# Patient Record
Sex: Female | Born: 1955 | Race: White | Hispanic: No | Marital: Married | Smoking: Never smoker
Health system: Southern US, Community
[De-identification: ages and names within clinical notes are randomized; demographics above are authoritative.]

## PROBLEM LIST (undated history)

## (undated) DIAGNOSIS — I10 Essential (primary) hypertension: Secondary | ICD-10-CM

## (undated) DIAGNOSIS — E079 Disorder of thyroid, unspecified: Secondary | ICD-10-CM

## (undated) HISTORY — PX: BLADDER REPAIR: SHX76

## (undated) HISTORY — PX: BREAST SURGERY: SHX581

---

## 2015-11-18 ENCOUNTER — Emergency Department (HOSPITAL_BASED_OUTPATIENT_CLINIC_OR_DEPARTMENT_OTHER)
Admission: EM | Admit: 2015-11-18 | Discharge: 2015-11-18 | Disposition: A | Discharge status: Home / Self Care | Payer: 59 | Attending: Emergency Medicine | Admitting: Emergency Medicine

## 2015-11-18 ENCOUNTER — Encounter (HOSPITAL_BASED_OUTPATIENT_CLINIC_OR_DEPARTMENT_OTHER): Payer: Self-pay | Admitting: *Deleted

## 2015-11-18 ENCOUNTER — Emergency Department (HOSPITAL_BASED_OUTPATIENT_CLINIC_OR_DEPARTMENT_OTHER): Payer: 59

## 2015-11-18 DIAGNOSIS — E079 Disorder of thyroid, unspecified: Secondary | ICD-10-CM | POA: Diagnosis not present

## 2015-11-18 DIAGNOSIS — S61304A Unspecified open wound of right ring finger with damage to nail, initial encounter: Secondary | ICD-10-CM | POA: Diagnosis present

## 2015-11-18 DIAGNOSIS — Y9301 Activity, walking, marching and hiking: Secondary | ICD-10-CM | POA: Diagnosis not present

## 2015-11-18 DIAGNOSIS — Z23 Encounter for immunization: Secondary | ICD-10-CM | POA: Diagnosis not present

## 2015-11-18 DIAGNOSIS — Y9289 Other specified places as the place of occurrence of the external cause: Secondary | ICD-10-CM | POA: Diagnosis not present

## 2015-11-18 DIAGNOSIS — W228XXA Striking against or struck by other objects, initial encounter: Secondary | ICD-10-CM | POA: Diagnosis not present

## 2015-11-18 DIAGNOSIS — Y998 Other external cause status: Secondary | ICD-10-CM | POA: Diagnosis not present

## 2015-11-18 DIAGNOSIS — G43809 Other migraine, not intractable, without status migrainosus: Secondary | ICD-10-CM | POA: Diagnosis not present

## 2015-11-18 HISTORY — DX: Disorder of thyroid, unspecified: E07.9

## 2015-11-18 MED ORDER — HYDROMORPHONE HCL 1 MG/ML IJ SOLN: 1.0000 mg | Freq: Once | INTRAMUSCULAR | Status: DC

## 2015-11-18 MED ORDER — CEPHALEXIN 500 MG PO CAPS: 500.0000 mg | ORAL_CAPSULE | Freq: Four times a day (QID) | ORAL | Status: DC

## 2015-11-18 MED ORDER — TETANUS-DIPHTHERIA TOXOIDS TD 5-2 LFU IM INJ
0.5000 mL | INJECTION | Freq: Once | INTRAMUSCULAR | Status: AC
  Administered 2015-11-18: 0.5 mL via INTRAMUSCULAR
  Filled 2015-11-18: qty 0.5

## 2015-11-18 MED ORDER — HYDROMORPHONE HCL 2 MG PO TABS: 2.0000 mg | ORAL_TABLET | ORAL | Status: DC | PRN

## 2015-11-18 MED ORDER — FENTANYL CITRATE (PF) 100 MCG/2ML IJ SOLN
50.0000 ug | Freq: Once | INTRAMUSCULAR | Status: AC
  Administered 2015-11-18: 50 ug via INTRAVENOUS
  Filled 2015-11-18: qty 2

## 2015-11-18 MED ORDER — DEXTROSE 5 % IV SOLN
1.0000 g | Freq: Once | INTRAVENOUS | Status: AC
  Administered 2015-11-18: 1 g via INTRAVENOUS
  Filled 2015-11-18: qty 10

## 2015-11-18 MED ORDER — ONDANSETRON HCL 4 MG/2ML IJ SOLN
4.0000 mg | Freq: Once | INTRAMUSCULAR | Status: AC
  Administered 2015-11-18: 4 mg via INTRAVENOUS
  Filled 2015-11-18: qty 2

## 2015-11-18 MED ORDER — HYDROMORPHONE HCL 1 MG/ML IJ SOLN
0.5000 mg | Freq: Once | INTRAMUSCULAR | Status: AC
  Administered 2015-11-18: 0.5 mg via INTRAVENOUS
  Filled 2015-11-18: qty 1

## 2015-11-18 MED ORDER — IBUPROFEN 800 MG PO TABS
800.0000 mg | ORAL_TABLET | Freq: Once | ORAL | Status: AC
  Administered 2015-11-18: 800 mg via ORAL
  Filled 2015-11-18: qty 1

## 2015-11-18 MED ORDER — ONDANSETRON 4 MG PO TBDP
4.0000 mg | ORAL_TABLET | Freq: Once | ORAL | Status: AC
  Administered 2015-11-18: 4 mg via ORAL
  Filled 2015-11-18: qty 1

## 2015-11-18 MED FILL — CEPHALEXIN 500 MG CAPSULE: 500 | 5 days supply | Qty: 20 | Fill #0

## 2015-11-18 MED FILL — HYDROmorphone HCL 2 MG TABS: 2 | 3 days supply | Qty: 20 | Fill #0

## 2015-11-18 NOTE — Discharge Instructions (Signed)
Traumatic Finger Amputation °A traumatic finger amputation is when you lose part or all of a finger because of an accident or injury. The severity of this type of injury can vary widely. It can mean that just the tip of your finger gets ripped off (avulsion), or it can mean that you completely lose a finger (amputation). °Traumatic finger amputation is a medical emergency. It requires immediate care to prevent further damage and to save the finger. °CAUSES °A traumatic finger amputation usually results from an accident that involves: °· A car. °· Power tools. °· Factory work. °· Farm or lawn equipment. °SYMPTOMS  °Symptoms of a traumatic finger amputation include: °· Bleeding. °· Pain. °· Damage to surrounding tissues, such as bones, muscles, tendons, and skin. °Severe injuries can sometimes lead to shock, which is a life-threatening condition in which your body fails to get blood, oxygen, and nutrients to vital organs. Some common symptoms of shock include low blood pressure, sweating, weakness, and pale skin. °DIAGNOSIS °Your health care provider will do a physical exam and ask for details about how your injury occurred. The physical exam will help your health care provider to determine the severity of the injury and the best way to repair it. X-rays may be done to check for damage to the surrounding bones and tissues. °TREATMENT °Treatment depends on the type of injury that you have and how bad it is. °· Your health care provider will clean the wound thoroughly and apply a medicine (anesthetic) to relieve pain. °· If just the tip of your finger was removed, the wound will typically heal on its own with a protective bandage (dressing) and regular cleaning. °· For more severe injuries, a portion of skin may need to be taken from another part of the body (graft) and attached to the wound site until the wound heals. °· If a large portion of the finger was amputated, it may be possible to reattach it surgically  (replantation). °HOME CARE INSTRUCTIONS °· Take medicines only as directed by your health care provider. °· If you were prescribed an antibiotic medicine, finish all of it even if you start to feel better. °· There are many different ways to close and cover a wound, including stitches (sutures), skin glue, and adhesive strips. Follow your health care provider's instructions about: °¨ Wound care. °¨ Dressing changes and removal. °¨ Wound closure removal. °· Check your wound every day for signs of infection. Watch for: °¨ Redness, swelling, or pain. °¨ Fluid, blood, or pus. °· Do exercises to strengthen your finger and hand as directed by your health care provider. °SEEK MEDICAL CARE IF: °· Your wound does not seem to be healing well. °· You have redness, swelling, or pain at your wound site. °· You have fluid, blood, or pus coming from your wound. °· You have a fever. °  °This information is not intended to replace advice given to you by your health care provider. Make sure you discuss any questions you have with your health care provider. °  °Document Released: 11/29/2001 Document Revised: 10/11/2014 Document Reviewed: 06/05/2014 °Elsevier Interactive Patient Education ©2016 [employer] ° °

## 2015-11-18 NOTE — ED Provider Notes (Signed)
CSN: [id number]     Arrival date & time 11/18/15  1201 History   First MD Initiated Contact with Patient 11/18/15 1350     Chief Complaint  Patient presents with  . Hand Injury     HPI She was walking her dog this am and the dog leash was wrapped around her hand when the dog was spooked and ran. Her distal 4th digit at the nail was avulsed. Bleeding controlled. Her husband gave her a pain pill.  Past Medical History  Diagnosis Date  . Thyroid disease    Past Surgical History  Procedure Laterality Date  . Bladder repair    . Breast surgery     No family history on file. Social History  Substance Use Topics  . Smoking status: Never Smoker   . Smokeless tobacco: None  . Alcohol Use: No   OB History    No data available     Review of Systems   All other systems reviewed and are negative Allergies  Codeine  Home Medications   Prior to Admission medications   Medication Sig Start Date End Date Taking? Authorizing Provider  cephALEXin (KEFLEX) 500 MG capsule Take 1 capsule (500 mg total) by mouth 4 (four) times daily. 11/18/15   Nelva Nay, MD  HYDROmorphone (DILAUDID) 2 MG tablet Take 1 tablet (2 mg total) by mouth every 4 (four) hours as needed for severe pain. 11/18/15   Nelva Nay, MD  Levothyroxine Sodium (SYNTHROID PO) Take by mouth.   Yes Historical Provider, MD   BP 142/86 mmHg  Pulse 70  Temp(Src) 97.9 F (36.6 C) (Oral)  Resp 18  Ht  (1.651 m)  Wt 160 lb (72.576 kg)  BMI 26.63 kg/m2  SpO2 95% Physical Exam Physical Exam  Nursing note and vitals reviewed. Constitutional: She is oriented to person, place, and time. She appears well-developed and well-nourished. No distress.  HENT:  Head: Normocephalic and atraumatic.  Eyes: Pupils are equal, round, and reactive to light.  Neck: Normal range of motion.  Cardiovascular: Normal rate and intact distal pulses.   Pulmonary/Chest: No respiratory distress.  Abdominal: Normal appearance. She exhibits  no distension.  Musculoskeletal: Normal range of motion.  the distal pad to the fourth finger of the right hand have been evulsed.  There was no active bleeding.  Possible bone exposure. Neurological: She is alert and oriented to person, place, and time. No cranial nerve deficit.  Skin: Skin is warm and dry. No rash noted.  Psychiatric: She has a normal mood and affect. Her behavior is normal.   ED Course  Procedures (including critical care time) Labs Review Labs Reviewed - No data to display  Imaging Review No results found for this or any previous visit. Dg Finger Ring Right  11/18/2015  CLINICAL DATA:  Recent distal fourth finger amputation EXAM: RIGHT RING FINGER 2+V COMPARISON:  None. FINDINGS: Soft tissue defect is noted distally surrounding the distal phalangeal tuft. No definitive bony abnormality is seen. IMPRESSION: No acute fracture noted. Diffuse distal soft tissue amputation is noted. Electronically Signed   By: Alcide Clever M.D.   On: 11/18/2015 12:51       EKG Interpretation None     I discussed the case with the hand surgeon on call who recommended patient have a Xeroflo dressing and she was given update on tetanus and antibiotics.  They will call her office this afternoon or tomorrow for follow-up probably tomorrow morning.  She was given pain medicine. MDM  Avulsion to fingertip.      Nelva Nay, MD 11/26/15 [phone]

## 2015-11-18 NOTE — ED Notes (Signed)
MD at bedside. Dr. Radford Pax in to talk with pt and husband.

## 2015-11-18 NOTE — ED Notes (Signed)
Pt given d/c instructions. Verbalizes understanding. No questions. Rx x 2. Narcotic precaution instructions given.

## 2015-11-18 NOTE — ED Notes (Signed)
She was walking her dog this am and the dog leash was wrapped around her hand when the dog was spooked and ran. Her distal 4th digit at the nail was avulsed. Bleeding controlled. Her husband gave her a pain pill.

## 2020-04-25 ENCOUNTER — Ambulatory Visit: Payer: 59 | Admitting: Orthopedic Surgery

## 2020-05-26 ENCOUNTER — Ambulatory Visit: Payer: Self-pay | Admitting: Surgical

## 2020-06-11 ENCOUNTER — Ambulatory Visit (INDEPENDENT_AMBULATORY_CARE_PROVIDER_SITE_OTHER): Payer: Medicare Other | Admitting: Orthopedic Surgery

## 2020-06-11 ENCOUNTER — Ambulatory Visit (INDEPENDENT_AMBULATORY_CARE_PROVIDER_SITE_OTHER): Payer: Medicare Other

## 2020-06-11 DIAGNOSIS — M79605 Pain in left leg: Secondary | ICD-10-CM | POA: Diagnosis not present

## 2020-06-11 DIAGNOSIS — M5410 Radiculopathy, site unspecified: Secondary | ICD-10-CM | POA: Diagnosis not present

## 2020-06-15 ENCOUNTER — Encounter: Payer: Self-pay | Admitting: Orthopedic Surgery

## 2020-06-15 NOTE — Progress Notes (Signed)
Office Visit Note   Patient: Jodi Brock           Date of Birth: 1956/07/15           MRN: [id number] Visit Date: 06/11/2020 Requested by: Elijio Miles., MD [address] [place],  [place] [address] PCP: Elijio Miles., MD  Subjective: Chief Complaint  Patient presents with  . Left Leg - Pain    HPI: Jodi Brock is a 64 y.o. female who presents to the office complaining of left leg pain.  She notes pain for about 5 months.  Pain comes and goes.  She notes pain is worse at night.  She describes intermittent pain between the groin, calf, thigh as well as associated left leg weakness and numbness/tingling on occasion.  Pain is worse with laying flat on her back.  She denies any surgery or injections in her back.  Denies any history of DVT and does not take any blood thinners.  Pain is not worse with coughing or sneezing and she denies any significant low back pain.  She has seen a chiropractor in the past.  Pain is worsening slowly.  Pain does not wake her up at night.  Pain does cause her to take a while to fall asleep.  She takes Aleve without significant relief.  She notes that her leg "feels cooler"..                ROS: All systems reviewed are negative as they relate to the chief complaint within the history of present illness.  Patient denies fevers or chills.  Assessment & Plan: Visit Diagnoses:  1. Pain in left leg   2. Radicular syndrome of left leg     Plan: Patient is a 64 year old female who presents complaint of left leg pain.  She has 53-month history of leg pain that is worsening slowly.  Pain is worse when she lays flat on her back.  She notes numbness and tingling as well as radicular leg pain that extends down her entire left leg.  Radiographs of the left hip taken today reveal no significant degenerative changes of the left hip joint.  Radiographs of the lumbar spine taken today reveal degenerative changes throughout the lumbar spine with spondylolisthesis noted at  L5-S1.  Impression is that patient is experiencing radicular left leg pain that is originating from her lumbar spine.  Ordered MRI of the lumbar spine for further evaluation.  Follow-up after MRI to review results.  Patient agreed with plan.  She does not take any blood thinners.  Follow-Up Instructions: No follow-ups on file.   Orders:  Orders Placed This Encounter  Procedures  . XR HIP UNILAT W OR W/O PELVIS 2-3 VIEWS LEFT  . XR Lumbar Spine 2-3 Views  . MR Lumbar Spine w/o contrast   No orders of the defined types were placed in this encounter.     Procedures: No procedures performed   Clinical Data: No additional findings.  Objective: Vital Signs: There were no vitals taken for this visit.  Physical Exam:  Constitutional: Patient appears well-developed HEENT:  Head: Normocephalic Eyes:EOM are normal Neck: Normal range of motion Cardiovascular: Normal rate Pulmonary/chest: Effort normal Neurologic: Patient is alert Skin: Skin is warm Psychiatric: Patient has normal mood and affect  Ortho Exam: Ortho exam demonstrates positive straight leg raise of the left leg.  No loss of sensation throughout the bilateral lower extremity dermatomes.  5/5 motor strength of the bilateral hip flexors, quadriceps, hamstring,  dorsiflexion, plantarflexion.  No clonus on exam.  No evidence of hypo or hyperreflexia.  Mild to moderate tenderness throughout the axial lumbar spine.  No tenderness throughout the paraspinal musculature.  No pain with hip range of motion.  Negative Stinchfield exam.  No calf tenderness on exam.  Negative Homans' sign.  Specialty Comments:  No specialty comments available.  Imaging: No results found.   PMFS History: There are no problems to display for this patient.  Past Medical History:  Diagnosis Date  . Thyroid disease     No family history on file.  Past Surgical History:  Procedure Laterality Date  . BLADDER REPAIR    . BREAST SURGERY     Social  History   Occupational History  . Not on file  Tobacco Use  . Smoking status: Never Smoker  Substance and Sexual Activity  . Alcohol use: No  . Drug use: No  . Sexual activity: Not on file

## 2020-06-18 ENCOUNTER — Encounter: Payer: Self-pay | Admitting: Orthopedic Surgery

## 2020-07-01 ENCOUNTER — Other Ambulatory Visit: Payer: Self-pay

## 2020-07-01 ENCOUNTER — Ambulatory Visit
Admission: RE | Admit: 2020-07-01 | Discharge: 2020-07-01 | Disposition: A | Discharge status: Home / Self Care | Payer: Medicare Other | Source: Ambulatory Visit | Attending: Orthopedic Surgery | Admitting: Orthopedic Surgery

## 2020-07-01 DIAGNOSIS — M79605 Pain in left leg: Secondary | ICD-10-CM

## 2020-07-02 ENCOUNTER — Ambulatory Visit (INDEPENDENT_AMBULATORY_CARE_PROVIDER_SITE_OTHER): Payer: Medicare Other | Admitting: Orthopedic Surgery

## 2020-07-02 DIAGNOSIS — M5410 Radiculopathy, site unspecified: Secondary | ICD-10-CM

## 2020-07-06 ENCOUNTER — Encounter: Payer: Self-pay | Admitting: Orthopedic Surgery

## 2020-07-06 NOTE — Progress Notes (Signed)
   Office Visit Note   Patient: Jodi Brock           Date of Birth: 1955/10/06           MRN: [id number] Visit Date: 07/02/2020 Requested by: Elijio Miles., MD [address] [place],  [place] [address] PCP: Elijio Miles., MD  Subjective: No chief complaint on file.   HPI: Ferne Reus is a 64 year old patient with low back pain.  Since have seen her she had an MRI scan.  Reports some left leg and right-sided back pain.  MRI scan is reviewed and she does have some left-sided facet edema at L5-S1.  No definite surgical problem in the back.  Takes occasional Aleve but cannot take Tylenol because of her liver enzyme problem.  Been going on for 8 months and now it slightly better.  Wants to continue to observe without intervention for now.              ROS: All systems reviewed are negative as they relate to the chief complaint within the history of present illness.  Patient denies  fevers or chills.   Assessment & Plan: Visit Diagnoses:  1. Radicular syndrome of left leg     Plan: Impression is left leg radicular symptoms and right-sided back pain all likely related to mild degenerative processes going on in her lumbar spine.  I think if her symptoms warrant then we could consider injection in the back but she wants to hold off on that for now.  She will continue to do some home stretching and strengthening exercises and avoid heavy lifting.  Follow-up as needed but injection would be the next step with her.  Follow-Up Instructions: Return if symptoms worsen or fail to improve.   Orders:  No orders of the defined types were placed in this encounter.  No orders of the defined types were placed in this encounter.     Procedures: No procedures performed   Clinical Data: No additional findings.  Objective: Vital Signs: There were no vitals taken for this visit.  Physical Exam:   Constitutional: Patient appears well-developed HEENT:  Head: Normocephalic Eyes:EOM are  normal Neck: Normal range of motion Cardiovascular: Normal rate Pulmonary/chest: Effort normal Neurologic: Patient is alert Skin: Skin is warm Psychiatric: Patient has normal mood and affect    Ortho Exam: Ortho exam demonstrates full active and passive range of motion of the feet ankles knees and hips.  Does have some mild left-sided back pain.  No definite trochanteric tenderness.  No nerve root tension signs.  Excellent motor or sensory strength in the legs.  No muscle atrophy.  Reflexes symmetric.  Specialty Comments:  No specialty comments available.  Imaging: No results found.   PMFS History: There are no problems to display for this patient.  Past Medical History:  Diagnosis Date  . Thyroid disease     History reviewed. No pertinent family history.  Past Surgical History:  Procedure Laterality Date  . BLADDER REPAIR    . BREAST SURGERY     Social History   Occupational History  . Not on file  Tobacco Use  . Smoking status: Never Smoker  Substance and Sexual Activity  . Alcohol use: No  . Drug use: No  . Sexual activity: Not on file

## 2021-05-16 ENCOUNTER — Emergency Department (HOSPITAL_BASED_OUTPATIENT_CLINIC_OR_DEPARTMENT_OTHER): Payer: Medicare HMO

## 2021-05-16 ENCOUNTER — Emergency Department (HOSPITAL_BASED_OUTPATIENT_CLINIC_OR_DEPARTMENT_OTHER)
Admission: EM | Admit: 2021-05-16 | Discharge: 2021-05-16 | Disposition: A | Discharge status: Home / Self Care | Payer: Medicare HMO | Attending: Emergency Medicine | Admitting: Emergency Medicine

## 2021-05-16 ENCOUNTER — Telehealth (HOSPITAL_BASED_OUTPATIENT_CLINIC_OR_DEPARTMENT_OTHER): Payer: Self-pay | Admitting: Emergency Medicine

## 2021-05-16 ENCOUNTER — Encounter (HOSPITAL_BASED_OUTPATIENT_CLINIC_OR_DEPARTMENT_OTHER): Payer: Self-pay | Admitting: Emergency Medicine

## 2021-05-16 ENCOUNTER — Other Ambulatory Visit: Payer: Self-pay

## 2021-05-16 DIAGNOSIS — W19XXXA Unspecified fall, initial encounter: Secondary | ICD-10-CM

## 2021-05-16 DIAGNOSIS — S52502A Unspecified fracture of the lower end of left radius, initial encounter for closed fracture: Secondary | ICD-10-CM

## 2021-05-16 DIAGNOSIS — S6992XA Unspecified injury of left wrist, hand and finger(s), initial encounter: Secondary | ICD-10-CM | POA: Diagnosis present

## 2021-05-16 DIAGNOSIS — M25532 Pain in left wrist: Secondary | ICD-10-CM | POA: Insufficient documentation

## 2021-05-16 DIAGNOSIS — W108XXA Fall (on) (from) other stairs and steps, initial encounter: Secondary | ICD-10-CM | POA: Insufficient documentation

## 2021-05-16 DIAGNOSIS — Y92812 Truck as the place of occurrence of the external cause: Secondary | ICD-10-CM | POA: Diagnosis not present

## 2021-05-16 DIAGNOSIS — Y9389 Activity, other specified: Secondary | ICD-10-CM | POA: Insufficient documentation

## 2021-05-16 MED ORDER — HYDROCODONE-ACETAMINOPHEN 5-325 MG PO TABS: 2.0000 | ORAL_TABLET | ORAL | 0 refills | Status: DC | PRN

## 2021-05-16 MED ORDER — HYDROCODONE-ACETAMINOPHEN 5-325 MG PO TABS
1.0000 | ORAL_TABLET | Freq: Once | ORAL | Status: AC
  Administered 2021-05-16: 1 via ORAL
  Filled 2021-05-16: qty 1

## 2021-05-16 MED ORDER — OXYCODONE HCL 5 MG PO TABS: 5.0000 mg | ORAL_TABLET | Freq: Four times a day (QID) | ORAL | 0 refills | Status: DC | PRN

## 2021-05-16 MED ORDER — SENNOSIDES-DOCUSATE SODIUM 8.6-50 MG PO TABS: 1.0000 | ORAL_TABLET | Freq: Every evening | ORAL | 0 refills | Status: DC | PRN

## 2021-05-16 NOTE — ED Notes (Signed)
Right arm remains immobilized with pillow, elevated with ice pack.

## 2021-05-16 NOTE — Discharge Instructions (Addendum)
Keep your splint clean and dry. You should apply a cool compress today to help reduce swelling and keep the wrist elevated above the level of your heart. Dr. Carollee Massed office will be in touch regarding a follow up appointment when you return. They should be calling on your cell phone while you are away.

## 2021-05-16 NOTE — ED Provider Notes (Signed)
Emergency Department Provider Note   I have reviewed the triage vital signs and the nursing notes.   HISTORY  Chief Complaint Fall   HPI Jodi Brock is a 65 y.o. female with past medical history reviewed below presents to the emergency department with left wrist pain and swelling after FOOSH.  Patient was packing the truck for the beach when she missed a step and fell backwards catching herself with her left hand.  She denies any head injury or loss of consciousness.  She had severe pain in the left wrist with some swelling.  Denies numbness or tingling.  No pain in the elbow or shoulder.  No bleeding or laceration.  No neck or back pain.  No presyncope symptoms prior to falling.  Patient states she was getting out of the truck and missed a step on the way down which caused her to stumble and then fall backwards.    Past Medical History:  Diagnosis Date   Thyroid disease     There are no problems to display for this patient.   Past Surgical History:  Procedure Laterality Date   BLADDER REPAIR     BREAST SURGERY      Allergies Codeine  History reviewed. No pertinent family history.  Social History Social History   Tobacco Use   Smoking status: Never  Substance Use Topics   Alcohol use: No   Drug use: No    Review of Systems  Constitutional: No fever/chills Eyes: No visual changes. ENT: No sore throat. Cardiovascular: Denies chest pain. Respiratory: Denies shortness of breath. Gastrointestinal: No abdominal pain.  No nausea, no vomiting.  No diarrhea.  No constipation. Genitourinary: Negative for dysuria. Musculoskeletal: Negative for back pain. Positive left wrist pain/swelling.  Skin: Negative for rash. Neurological: Negative for headaches, focal weakness or numbness.  10-point ROS otherwise negative.  ____________________________________________   PHYSICAL EXAM:  VITAL SIGNS: ED Triage Vitals  Enc Vitals Group     BP 05/16/21 1116 125/64      Pulse Rate 05/16/21 1116 60     Resp 05/16/21 1116 18     Temp 05/16/21 1116 97.6 F (36.4 C)     Temp Source 05/16/21 1116 Oral     SpO2 05/16/21 1116 100 %     Weight 05/16/21 1119 168 lb (76.2 kg)     Height 05/16/21 1119 5\' 4"  (1.626 m)    Constitutional: Alert and oriented. Well appearing and in no acute distress. Eyes: Conjunctivae are normal.  Head: Atraumatic. Nose: No congestion/rhinnorhea. Mouth/Throat: Mucous membranes are moist.  Neck: No stridor. No cervical spine tenderness to palpation. Cardiovascular: Normal rate, regular rhythm. Good peripheral circulation. Grossly normal heart sounds.   Respiratory: Normal respiratory effort.  No retractions. Lungs CTAB. Gastrointestinal: Soft and nontender. No distention.  Musculoskeletal: Positive left wrist swelling dorsally without laceration or evidence of open fracture. No elbow or shoulder tenderness. Normal ROM of the fingers and normal sensation in the hand/fingers with brisk capillary refill.  Neurologic:  Normal speech and language. No gross focal neurologic deficits are appreciated.  Skin:  Skin is warm, dry and intact. No rash noted.  ____________________________________________  RADIOLOGY  DG Wrist Complete Left  Result Date: 05/16/2021 CLINICAL DATA:  Fall, swelling. EXAM: LEFT WRIST - COMPLETE 3+ VIEW COMPARISON:  None. FINDINGS: Displaced/comminuted fracture of the distal LEFT radius, with involvement of the articular surface and loss of the normal volar alignment at the radiocarpal joint. Carpal bones appear intact and normally aligned. Degenerative osteoarthritic  changes at the first [facility] joint, moderate in degree. IMPRESSION: Displaced/comminuted fracture of the distal LEFT radius, with involvement of the articular surface and loss of the normal volar alignment at the radiocarpal joint. Electronically Signed   By: Bary Richard M.D.   On: 05/16/2021 12:17   CT WRIST LEFT WO CONTRAST  Result Date:  05/16/2021 CLINICAL DATA:  Evaluate left wrist fracture EXAM: CT OF THE LEFT WRIST WITHOUT CONTRAST TECHNIQUE: Multidetector CT imaging was performed according to the standard protocol. Multiplanar CT image reconstructions were also generated. COMPARISON:  X-ray 05/16/2021 FINDINGS: Bones/Joint/Cartilage Acute comminuted fracture of the distal left radius with intra-articular extension to the radiocarpal and distal radioulnar joints. Fracture is mildly impacted. The dominant fracture fragment along the dorsal aspect of the wrist is mildly displaced and dorsally angulated resulting in up to 4.5 mm of articular-surface diastasis at the level of the lunate fossa. Fracture involves Lister's tubercle. Radiocarpal joint alignment is maintained without dislocation. Tiny mildly displaced fracture fragment adjacent to the tip of the ulnar styloid. 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 Ligaments Suboptimally assessed by CT. Muscles and Tendons No acute musculotendinous abnormality by CT. Soft tissues Soft tissue swelling about the fracture site. No organized fluid collection or hematoma. IMPRESSION: 1. Acute comminuted intra-articular fracture of the distal left radius, as described above. 2. Tiny mildly displaced fracture fragment adjacent to the tip of the ulnar styloid. 3. 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 Electronically Signed   By: Duanne Guess D.O.   On: 05/16/2021 13:54    ____________________________________________   PROCEDURES  Procedure(s) performed:   Procedures  None  ____________________________________________   INITIAL IMPRESSION / ASSESSMENT AND PLAN / ED COURSE  Pertinent labs & imaging results that were available during my care of the patient were reviewed by me and considered in my medical decision making (see chart for details).   Patient presents to the ED with wrist pain after FOOSH. Distal radius fracture noted. Patient reports  seeing Dr. Janee Morn in the past. Will reach out regarding follow up timeframe.   Spoke with Dr. Janee Morn. He will coordinate f/u. Patient splinted and CT completed. Patient's [place] drug database reviewed. Initially send Vicodin Rx to pharmacy then patient recalls that she was told to avoid Tylenol. Changed Rx to Roxicodone. Sent constipation meds as well. Advised to avoid driving, EtOH, and other sedating/pain medications. Patient's husband is here and will be driving.  ____________________________________________  FINAL CLINICAL IMPRESSION(S) / ED DIAGNOSES  Final diagnoses:  Closed fracture of distal end of left radius, unspecified fracture morphology, initial encounter  Fall, initial encounter     MEDICATIONS GIVEN DURING THIS VISIT:  Medications  HYDROcodone-acetaminophen (NORCO/VICODIN) 5-325 MG per tablet 1 tablet (1 tablet Oral Given 05/16/21 1128)     NEW OUTPATIENT MEDICATIONS STARTED DURING THIS VISIT:  New Prescriptions   OXYCODONE (ROXICODONE) 5 MG IMMEDIATE RELEASE TABLET    Take 1 tablet (5 mg total) by mouth every 6 (six) hours as needed for severe pain.   SENNA-DOCUSATE (SENOKOT-S) 8.6-50 MG TABLET    Take 1 tablet by mouth at bedtime as needed for mild constipation or moderate constipation.    Note:  This document was prepared using Dragon voice recognition software and may include unintentional dictation errors.  Alona Bene, MD, [facility] Emergency Medicine    Aryona Sill, Arlyss Repress, MD 05/16/21 1434

## 2021-05-16 NOTE — ED Triage Notes (Signed)
Pt arrives pov with c/o mechanical fall, denies hitting head or loc. Pt c/o L wrist pain. Swelling and deformity noted. Endorses 2 x aleve pta

## 2021-05-16 NOTE — ED Notes (Signed)
Tech in to apply to apply splint

## 2022-04-26 IMAGING — CR DG WRIST COMPLETE 3+V*L*
4 series · 4 of 4 positions shown · non-contrast
Comparison: None.

CLINICAL DATA: Fall, swelling.

EXAM:
LEFT WRIST - COMPLETE 3+ VIEW

[x wrist pa left]
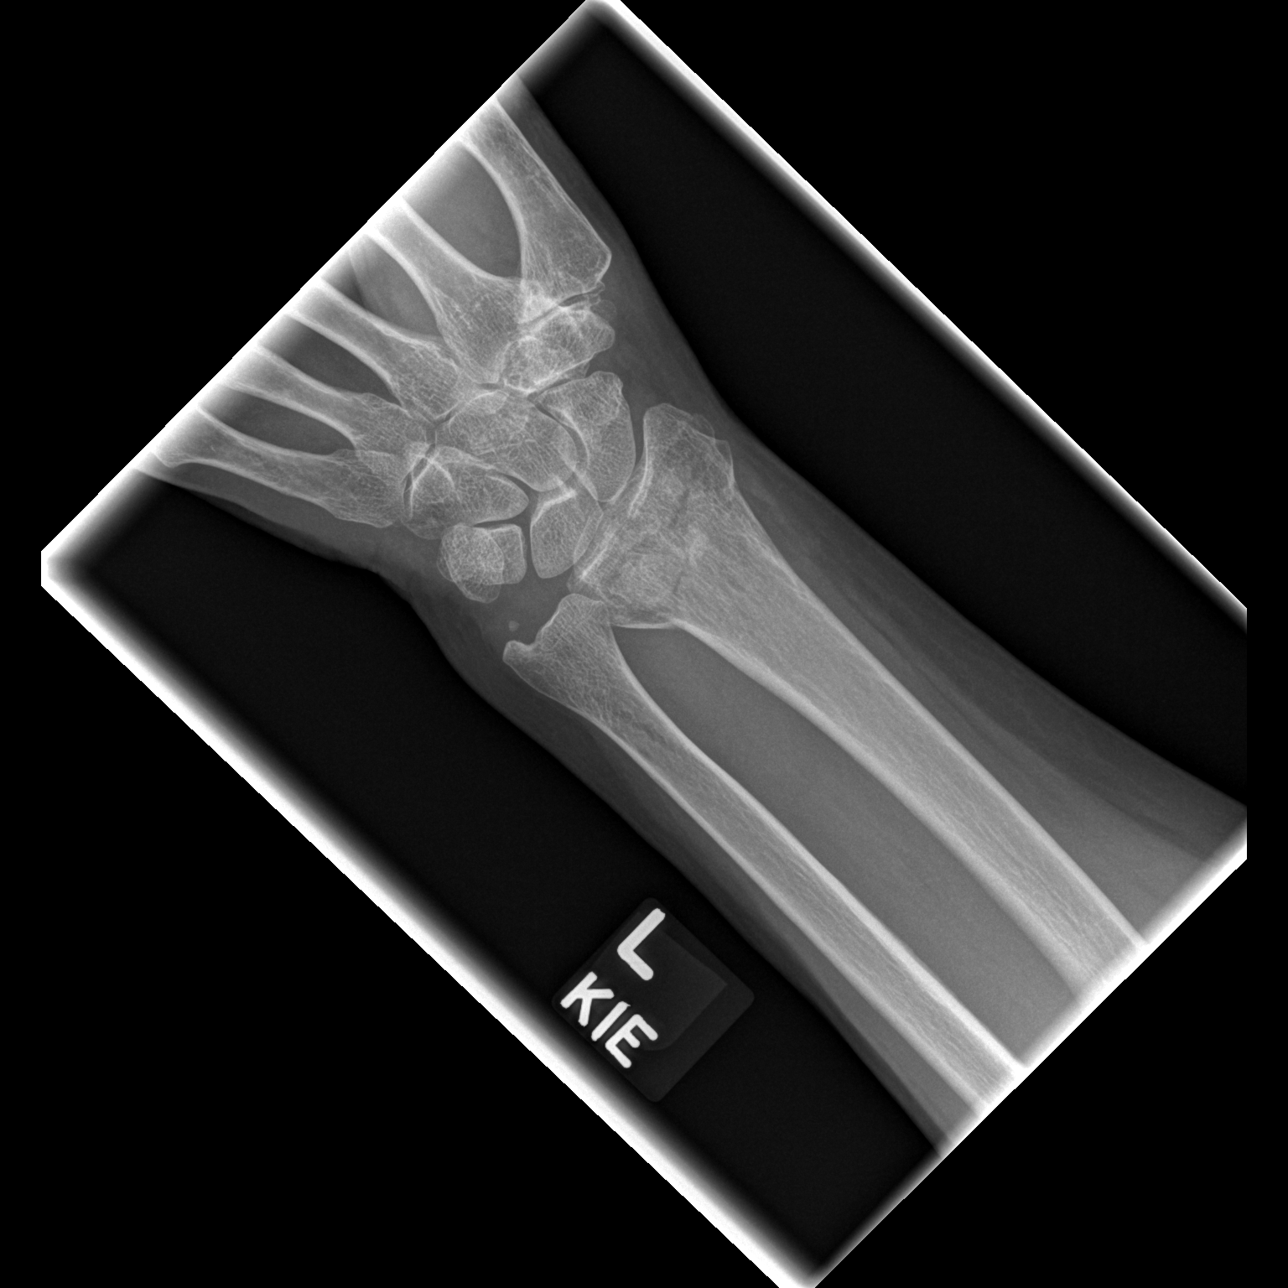

[x wrist obl left]
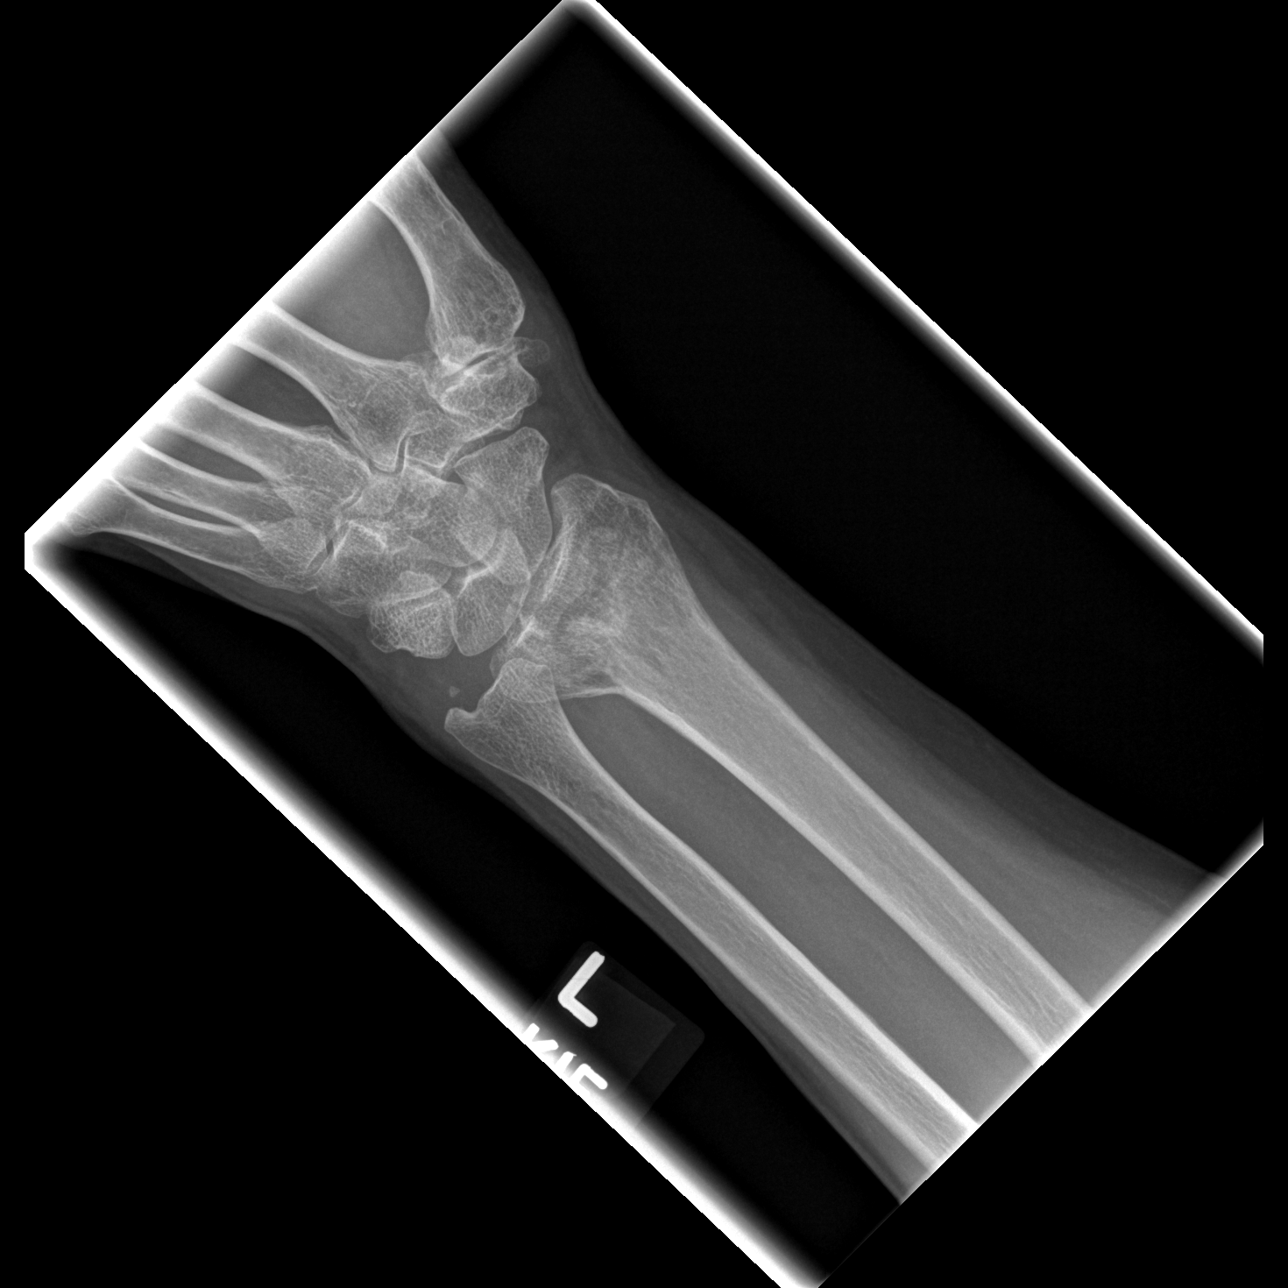

[x wrist lat left]
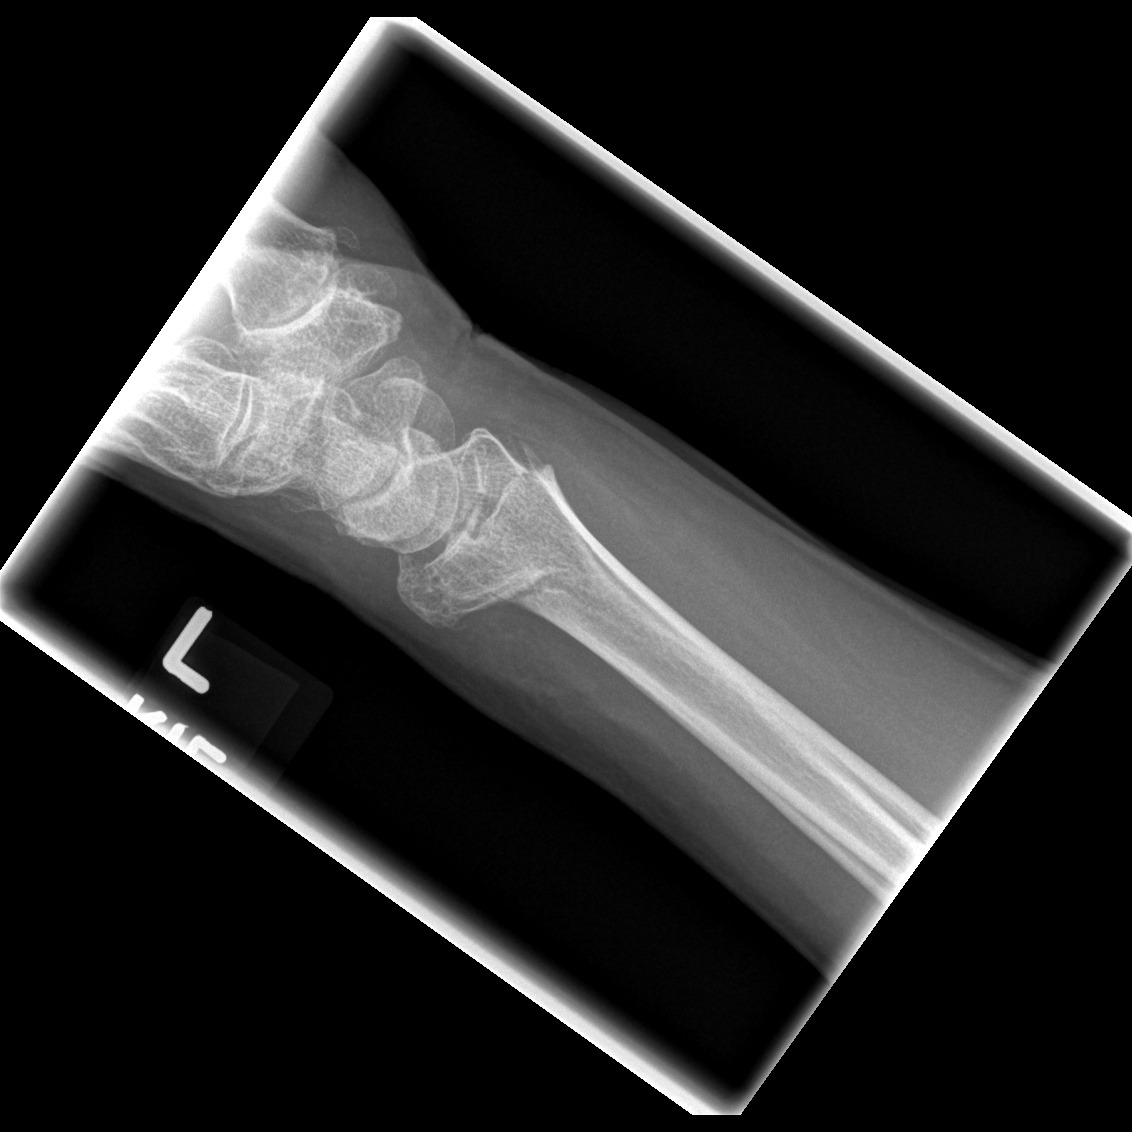

[x navicular]
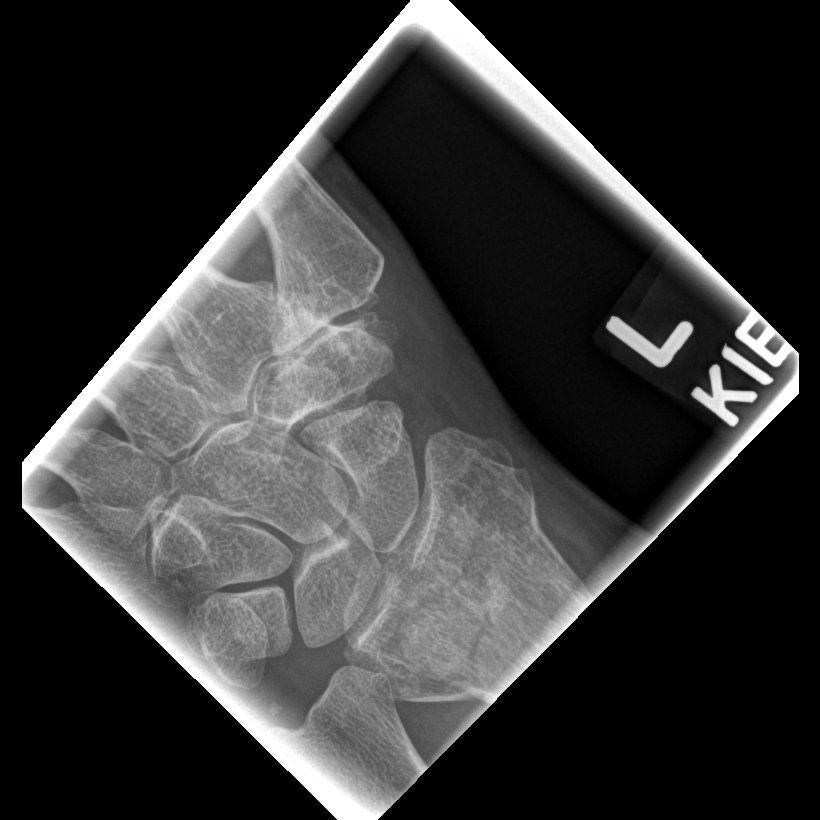

[4 of 4 positions shown; findings below may reference images not displayed]

FINDINGS: Displaced/comminuted fracture of the distal LEFT radius, with
involvement of the articular surface and loss of the normal volar
alignment at the radiocarpal joint. Carpal bones appear intact and
normally aligned. Degenerative osteoarthritic changes at the first
[facility] joint, moderate in degree.
IMPRESSION: Displaced/comminuted fracture of the distal LEFT radius, with
involvement of the articular surface and loss of the normal volar
alignment at the radiocarpal joint.

## 2022-09-08 ENCOUNTER — Other Ambulatory Visit: Payer: Self-pay | Admitting: Orthopedic Surgery

## 2022-09-08 DIAGNOSIS — M25562 Pain in left knee: Secondary | ICD-10-CM

## 2022-09-25 ENCOUNTER — Ambulatory Visit
Admission: RE | Admit: 2022-09-25 | Discharge: 2022-09-25 | Disposition: A | Discharge status: Home / Self Care | Payer: Medicare HMO | Source: Ambulatory Visit | Attending: Orthopedic Surgery | Admitting: Orthopedic Surgery

## 2022-09-25 DIAGNOSIS — M25562 Pain in left knee: Secondary | ICD-10-CM

## 2023-08-11 ENCOUNTER — Emergency Department (HOSPITAL_BASED_OUTPATIENT_CLINIC_OR_DEPARTMENT_OTHER)
Admission: EM | Admit: 2023-08-11 | Discharge: 2023-08-11 | Disposition: A | Discharge status: Home / Self Care | Payer: Medicare HMO | Attending: Emergency Medicine | Admitting: Emergency Medicine

## 2023-08-11 ENCOUNTER — Emergency Department (HOSPITAL_BASED_OUTPATIENT_CLINIC_OR_DEPARTMENT_OTHER): Payer: Medicare HMO

## 2023-08-11 ENCOUNTER — Other Ambulatory Visit: Payer: Self-pay

## 2023-08-11 ENCOUNTER — Encounter (HOSPITAL_BASED_OUTPATIENT_CLINIC_OR_DEPARTMENT_OTHER): Payer: Self-pay

## 2023-08-11 DIAGNOSIS — I493 Ventricular premature depolarization: Secondary | ICD-10-CM | POA: Insufficient documentation

## 2023-08-11 DIAGNOSIS — Z79899 Other long term (current) drug therapy: Secondary | ICD-10-CM | POA: Insufficient documentation

## 2023-08-11 DIAGNOSIS — R072 Precordial pain: Secondary | ICD-10-CM | POA: Diagnosis present

## 2023-08-11 DIAGNOSIS — M542 Cervicalgia: Secondary | ICD-10-CM | POA: Diagnosis not present

## 2023-08-11 HISTORY — DX: Essential (primary) hypertension: I10

## 2023-08-11 LAB — CBC
HCT: 42.6 % (ref 36.0–46.0)
Hemoglobin: 14.1 g/dL (ref 12.0–15.0)
MCH: 29.6 pg (ref 26.0–34.0)
MCHC: 33.1 g/dL (ref 30.0–36.0)
MCV: 89.3 fL (ref 80.0–100.0)
Platelets: 208 10*3/uL (ref 150–400)
RBC: 4.77 MIL/uL (ref 3.87–5.11)
RDW: 12.5 % (ref 11.5–15.5)
WBC: 6.7 10*3/uL (ref 4.0–10.5)
nRBC: 0 % (ref 0.0–0.2)

## 2023-08-11 LAB — BASIC METABOLIC PANEL WITH GFR
Anion gap: 11 (ref 5–15)
BUN: 20 mg/dL (ref 8–23)
CO2: 23 mmol/L (ref 22–32)
Calcium: 9.5 mg/dL (ref 8.9–10.3)
Chloride: 103 mmol/L (ref 98–111)
Creatinine, Ser: 0.84 mg/dL (ref 0.44–1.00)
GFR, Estimated: >60 mL/min
Glucose, Bld: 109 mg/dL — ABNORMAL HIGH (ref 70–99)
Potassium: 4 mmol/L (ref 3.5–5.1)
Sodium: 137 mmol/L (ref 135–145)

## 2023-08-11 LAB — TROPONIN I (HIGH SENSITIVITY)
Troponin I (High Sensitivity): 4 ng/L
Troponin I (High Sensitivity): 3 ng/L

## 2023-08-11 NOTE — Discharge Instructions (Signed)

## 2023-08-11 NOTE — ED Triage Notes (Addendum)
Pt reports central chest pain that radiates into her neck, described as a dull pressure and is worse when laying down, onset Saturday; associated with left sided neck pain that has been going on for a while. Denies shortness of breath, dizziness/n/v/d.   Pt reports she has taken two 81mg  aspirin prior to arrival.

## 2023-08-11 NOTE — ED Provider Notes (Signed)
[place] [facility] Provider Note   CSN: [id number] Arrival date & time: 08/11/23  0058     History  Chief Complaint  Patient presents with   Chest Pain    Jodi Brock is a 67 y.o. female.  The history is provided by the patient and the spouse.   Patient with history of hypertension presents with chest pain  Patient reports for the past 5 days she has chest pressure when she lays flat.  This only seems to occur at night.  She also reports left-sided neck pain has been ongoing for quite some time.  She denies any other symptoms-no shortness of breath, no fevers or vomiting.  No diaphoresis.  No dizziness. No known history of CAD/CVA.  No previous history of PE.  She reports strong family history of CAD She is very active during the day without any chest pain or shortness of breath or fatigue. She does report increased chocolate intake prior to bedtime over the past 2 weeks  Home Medications Prior to Admission medications   Medication Sig Start Date End Date Taking? Authorizing Provider  Levothyroxine Sodium (SYNTHROID PO) Take by mouth.    [provider]      Allergies    Codeine    Review of Systems   Review of Systems  Constitutional:  Negative for diaphoresis and fever.  Respiratory:  Negative for shortness of breath.   Cardiovascular:  Positive for chest pain.  Neurological:  Negative for dizziness.    Physical Exam Updated Vital Signs BP (!) 154/63   Pulse (!) 55   Temp 97.9 F (36.6 C) (Oral)   Resp 16   Ht 1.651 m (5\' 5" )   Wt 77.1 kg   SpO2 96%   BMI 28.29 kg/m  Physical Exam CONSTITUTIONAL: Well developed/well nourished HEAD: Normocephalic/atraumatic EYES: EOMI/PERRL ENMT: Mucous membranes moist NECK: supple no meningeal signs CV: S1/S2 noted, no murmurs/rubs/gallops noted LUNGS: Lungs are clear to auscultation bilaterally, no apparent distress ABDOMEN: soft, nontender NEURO: Pt is  awake/alert/appropriate, moves all extremitiesx4.  No facial droop.   EXTREMITIES: pulses normal/equal, full ROM, no lower extremity edema or tenderness SKIN: warm, color normal PSYCH: no abnormalities of mood noted, alert and oriented to situation  ED Results / Procedures / Treatments   Labs (all labs ordered are listed, but only abnormal results are displayed) Labs Reviewed  BASIC METABOLIC PANEL - Abnormal; Notable for the following components:      Result Value   Glucose, Bld 109 (*)    All other components within normal limits  CBC  TROPONIN I (HIGH SENSITIVITY)  TROPONIN I (HIGH SENSITIVITY)    EKG EKG Interpretation Date/Time:  Thursday August 11 2023 01:11:31 EST Ventricular Rate:  65 PR Interval:  155 QRS Duration:  101 QT Interval:  416 QTC Calculation: 433 R Axis:   2  Text Interpretation: Sinus rhythm Low voltage, precordial leads No previous ECGs available Confirmed by Zadie Rhine ([id number]) on 08/11/2023 1:14:17 AM  Radiology DG Chest 2 View  Result Date: 08/11/2023 CLINICAL DATA:  Chest pain EXAM: CHEST - 2 VIEW COMPARISON:  None Available. FINDINGS: Lungs are clear.  No pleural effusion or pneumothorax. The heart is normal in size. Visualized osseous structures are within normal limits. IMPRESSION: Normal chest radiographs. Electronically Signed   By: Charline Bills M.D.   On: 08/11/2023 01:58    Procedures Procedures    Medications Ordered in ED Medications - No data to display  ED  Course/ Medical Decision Making/ A&P Clinical Course as of 08/11/23 0432  Thu Aug 11, 2023  9147 Patient overall well-appearing.  She reports the symptoms recently just while lying flat. She had brief episodes here in the ER.  She is now pain-free.  Workup thus far has been unremarkable.  She did have occasional PVCs which could be triggering some of her symptoms.  She will be referred to cardiology [DW]    Clinical Course User Index [DW] Zadie Rhine, MD               HEART Score: 3                    Medical Decision Making Amount and/or Complexity of Data Reviewed Labs: ordered. Radiology: ordered.   This patient presents to the ED for concern of chest pain, this involves an extensive number of treatment options, and is a complaint that carries with it a high risk of complications and morbidity.  The differential diagnosis includes but is not limited to acute coronary syndrome, aortic dissection, pulmonary embolism, pericarditis, pneumothorax, pneumonia, myocarditis, pleurisy, esophageal rupture    Comorbidities that complicate the patient evaluation: Patient's presentation is complicated by their history of hypertension  Additional history obtained: Additional history obtained from spouse Records reviewed Primary Care Documents  Lab Tests: I Ordered, and personally interpreted labs.  The pertinent results include: Labs unremarkable  Imaging Studies ordered: I ordered imaging studies including X-ray chest   I independently visualized and interpreted imaging which showed no acute findings I agree with the radiologist interpretation  Cardiac Monitoring: The patient was maintained on a cardiac monitor.  I personally viewed and interpreted the cardiac monitor which showed an underlying rhythm of:  sinus rhythm and PVCs noted   Test Considered: Patient is low risk / negative by heart score, therefore do not feel that cardiac admission is indicated.  Reevaluation: After the interventions noted above, I reevaluated the patient and found that they have :improved  Complexity of problems addressed: Patient's presentation is most consistent with  acute presentation with potential threat to life or bodily function  Disposition: After consideration of the diagnostic results and the patient's response to treatment,  I feel that the patent would benefit from discharge   .           Final Clinical Impression(s) / ED  Diagnoses Final diagnoses:  Precordial pain  PVC's (premature ventricular contractions)    Rx / DC Orders ED Discharge Orders     None         Zadie Rhine, MD 08/11/23 [phone]
